# Patient Record
Sex: Male | Born: 1965 | Race: White | Hispanic: No | Marital: Married | Smoking: Current every day smoker
Health system: Southern US, Community
[De-identification: ages and names within clinical notes are randomized; demographics above are authoritative.]

## PROBLEM LIST (undated history)

## (undated) DIAGNOSIS — K219 Gastro-esophageal reflux disease without esophagitis: Secondary | ICD-10-CM

## (undated) HISTORY — PX: CHOLECYSTECTOMY: SHX55

---

## 1999-11-01 ENCOUNTER — Encounter: Payer: Self-pay | Admitting: Orthopedic Surgery

## 1999-11-01 ENCOUNTER — Encounter: Admission: RE | Admit: 1999-11-01 | Discharge: 1999-11-01 | Payer: Self-pay | Admitting: Orthopedic Surgery

## 2000-01-21 ENCOUNTER — Encounter: Payer: Self-pay | Admitting: Anesthesiology

## 2000-01-21 ENCOUNTER — Encounter: Admission: RE | Admit: 2000-01-21 | Discharge: 2000-01-30 | Payer: Self-pay | Admitting: Anesthesiology

## 2000-03-30 ENCOUNTER — Encounter: Admission: RE | Admit: 2000-03-30 | Discharge: 2000-06-28 | Payer: Self-pay | Admitting: Anesthesiology

## 2008-10-26 ENCOUNTER — Emergency Department (HOSPITAL_BASED_OUTPATIENT_CLINIC_OR_DEPARTMENT_OTHER): Admission: EM | Admit: 2008-10-26 | Discharge: 2008-10-26 | Payer: Self-pay | Admitting: Emergency Medicine

## 2008-10-30 ENCOUNTER — Emergency Department (HOSPITAL_BASED_OUTPATIENT_CLINIC_OR_DEPARTMENT_OTHER): Admission: EM | Admit: 2008-10-30 | Discharge: 2008-10-30 | Payer: Self-pay | Admitting: Emergency Medicine

## 2008-11-02 ENCOUNTER — Encounter: Admission: RE | Admit: 2008-11-02 | Discharge: 2008-12-18 | Payer: Self-pay | Admitting: Orthopedic Surgery

## 2009-02-15 ENCOUNTER — Ambulatory Visit: Payer: Self-pay | Admitting: Interventional Radiology

## 2009-02-15 ENCOUNTER — Encounter: Payer: Self-pay | Admitting: Emergency Medicine

## 2009-02-15 ENCOUNTER — Inpatient Hospital Stay (HOSPITAL_COMMUNITY): Admission: AD | Admit: 2009-02-15 | Discharge: 2009-02-16 | Payer: Self-pay | Admitting: Internal Medicine

## 2009-11-06 IMAGING — CR DG LUMBAR SPINE COMPLETE 4+V
5 series · 5 of 5 positions shown · non-contrast
Comparison: None

CLINICAL DATA: Back pain.

LUMBAR SPINE - COMPLETE 4+ VIEW

[t l-spine a.p.]
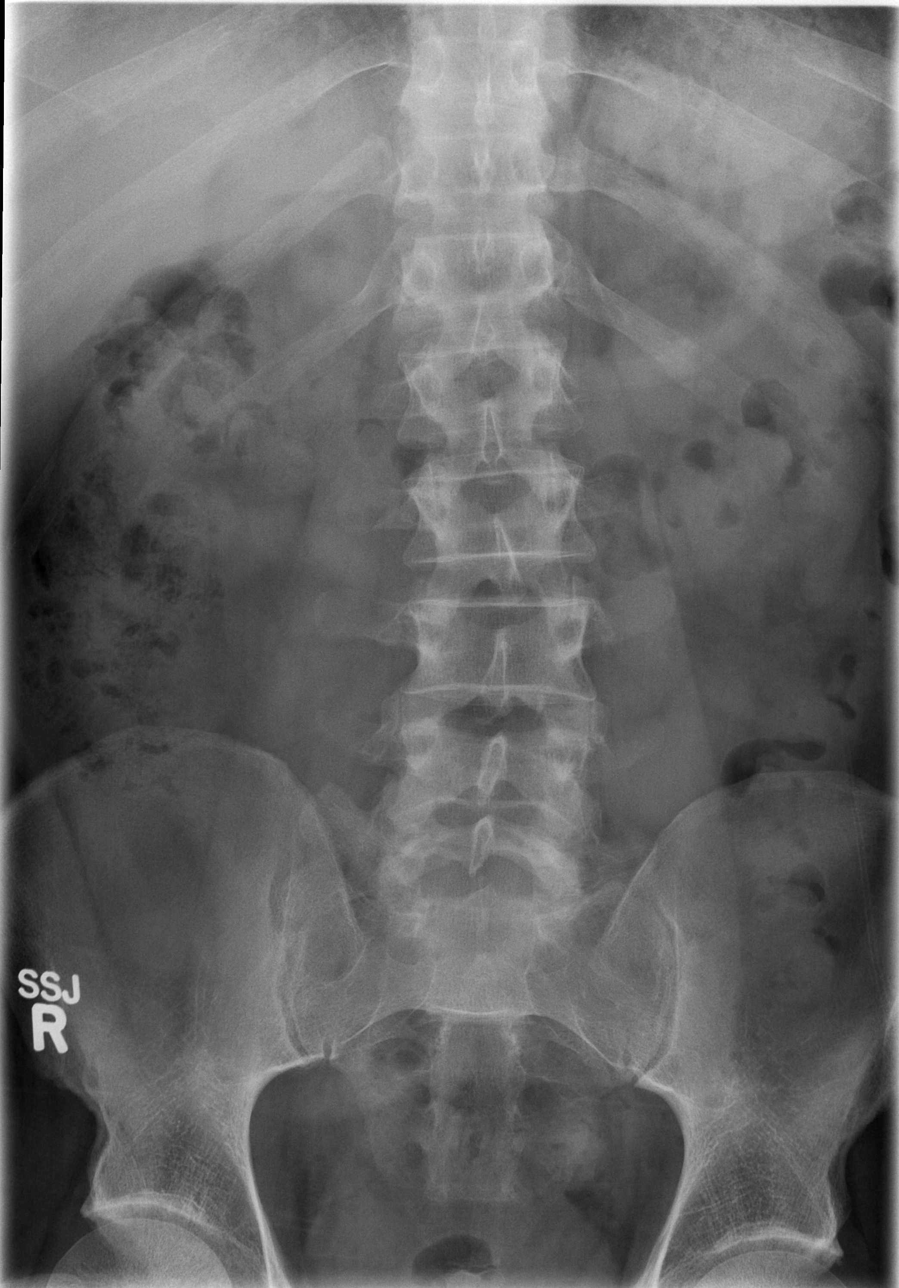

[t l-spine oblique exposure (1 of 2)]
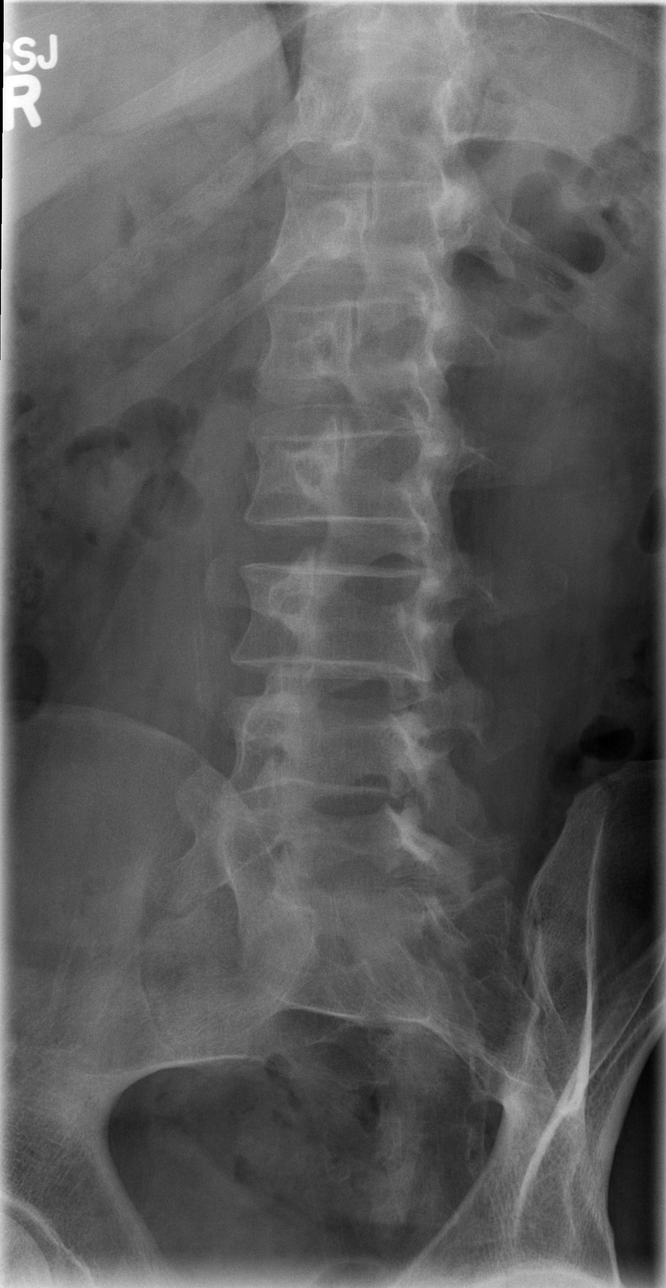

[t l-spine oblique exposure (2 of 2)]
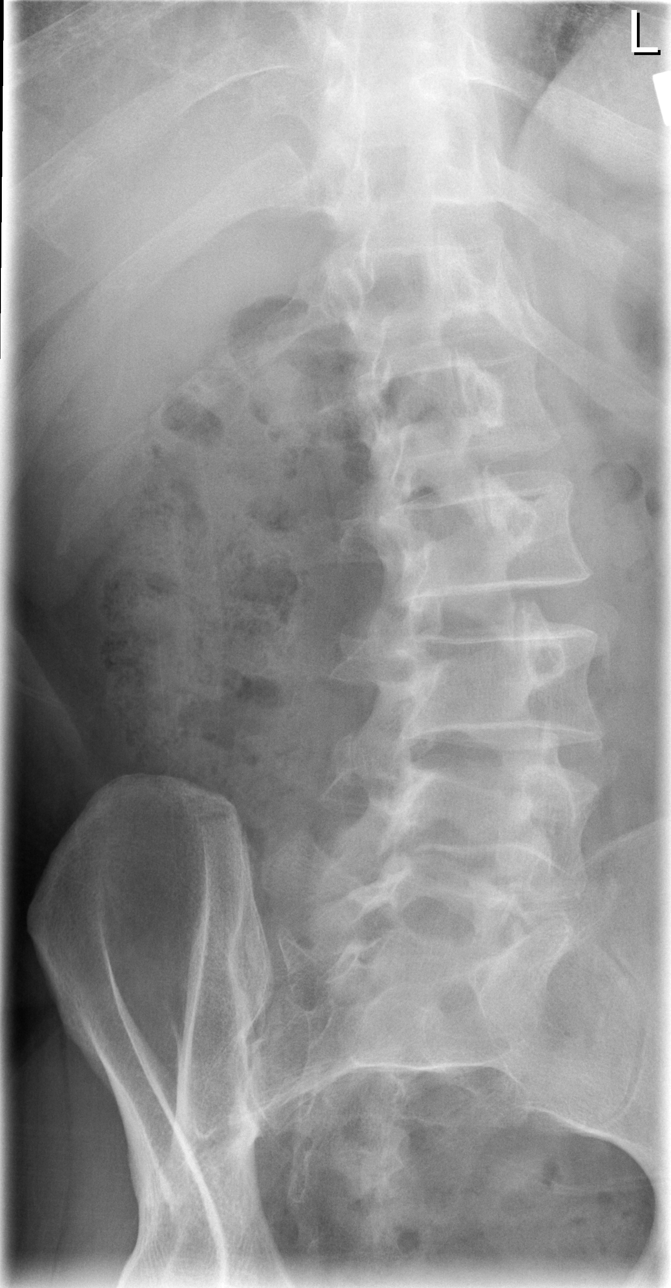

[t l-spine lat]
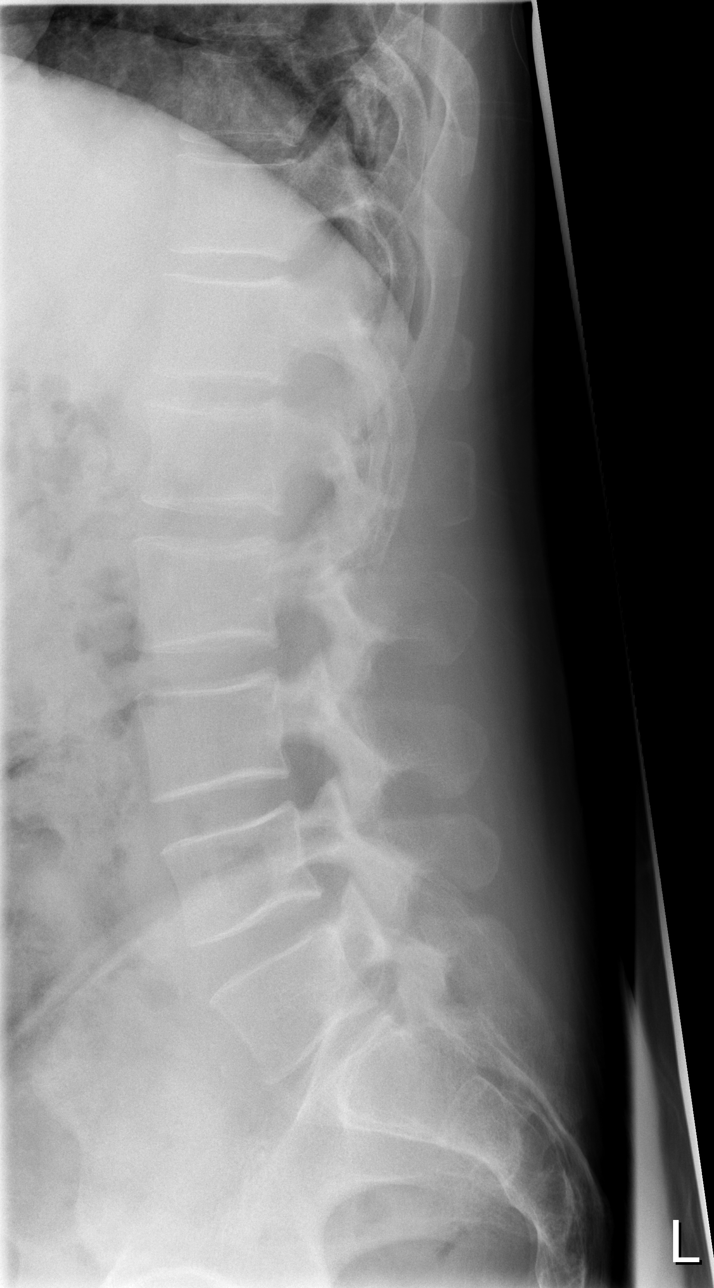

[t l-spine l5-s1 spot]
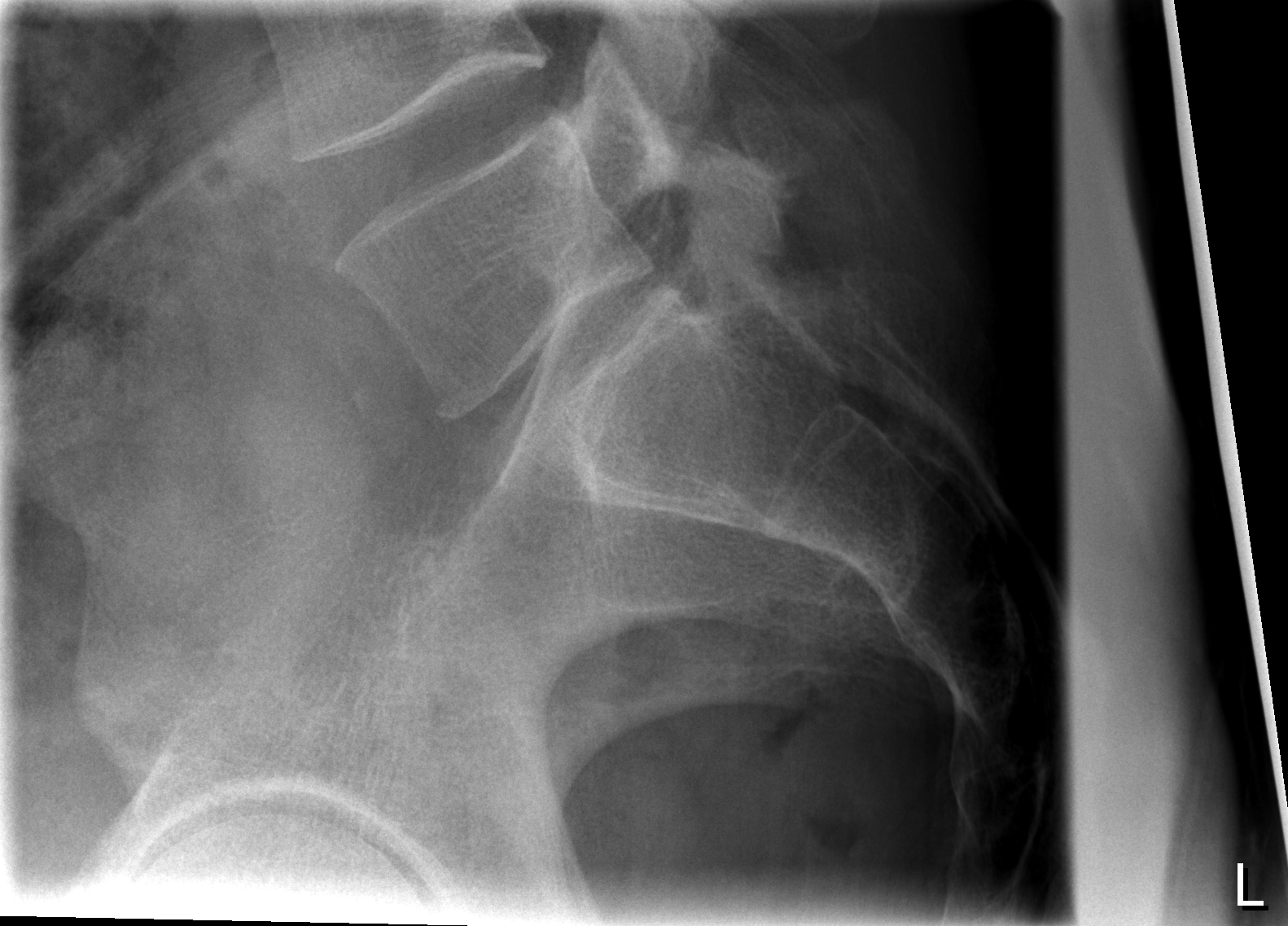

[5 of 5 positions shown; findings below may reference images not displayed]

FINDINGS: Mild levoconvex lumbar scoliosis, centered around L2-L3.
No fracture is identified.  Sacralization of the right L5
transverse process.  Vertebral body height is preserved.  No pars
defects are identified.  Intervertebral disc space preserved as
well.  Five lumbar type vertebral bodies are present.  Lumbosacral
junction normal.
IMPRESSION: No acute osseous abnormality.

## 2011-01-19 ENCOUNTER — Encounter: Payer: Self-pay | Admitting: Family Medicine

## 2011-04-15 LAB — HEMOGLOBIN A1C: Mean Plasma Glucose: 94 mg/dL

## 2011-04-15 LAB — DIFFERENTIAL
Basophils Absolute: 0 10*3/uL (ref 0.0–0.1)
Basophils Relative: 0 % (ref 0–1)
Lymphocytes Relative: 61 % — ABNORMAL HIGH (ref 12–46)
Monocytes Absolute: 0.3 10*3/uL (ref 0.1–1.0)
Neutro Abs: 2 10*3/uL (ref 1.7–7.7)
Neutrophils Relative %: 32 % — ABNORMAL LOW (ref 43–77)

## 2011-04-15 LAB — BASIC METABOLIC PANEL
BUN: 10 mg/dL (ref 6–23)
CO2: 24 mEq/L (ref 19–32)
Calcium: 9.1 mg/dL (ref 8.4–10.5)
Chloride: 109 mEq/L (ref 96–112)
Creatinine, Ser: 0.7 mg/dL (ref 0.4–1.5)
GFR calc Af Amer: >60 mL/min (ref >60)
GFR calc non Af Amer: >60 mL/min (ref >60)
Glucose, Bld: 130 mg/dL — ABNORMAL HIGH (ref 70–99)
Potassium: 3.7 mEq/L (ref 3.5–5.1)
Sodium: 141 mEq/L (ref 135–145)

## 2011-04-15 LAB — CBC
HCT: 42.3 % (ref 39.0–52.0)
Hemoglobin: 14.3 g/dL (ref 13.0–17.0)
Hemoglobin: 14.1 g/dL (ref 13.0–17.0)
MCHC: 33.8 g/dL (ref 30.0–36.0)
MCHC: 35.1 g/dL (ref 30.0–36.0)
MCV: 98 fL (ref 78.0–100.0)
Platelets: 161 10*3/uL (ref 150–400)
Platelets: 142 10*3/uL — ABNORMAL LOW (ref 150–400)
RBC: 4.3 MIL/uL (ref 4.22–5.81)
RDW: 10.5 % — ABNORMAL LOW (ref 11.5–15.5)
RDW: 12.7 % (ref 11.5–15.5)
WBC: 5.9 10*3/uL (ref 4.0–10.5)

## 2011-04-15 LAB — CARDIAC PANEL(CRET KIN+CKTOT+MB+TROPI)
CK, MB: 1 ng/mL (ref 0.3–4.0)
CK, MB: 1.1 ng/mL (ref 0.3–4.0)
Relative Index: INVALID (ref 0.0–2.5)
Relative Index: INVALID (ref 0.0–2.5)
Total CK: 48 U/L (ref 7–232)
Total CK: 51 U/L (ref 7–232)
Troponin I: 0.01 ng/mL (ref 0.00–0.06)
Troponin I: <0.01 ng/mL (ref 0.00–0.06)

## 2011-04-15 LAB — POCT CARDIAC MARKERS
CKMB, poc: <1 ng/mL — ABNORMAL LOW (ref 1.0–8.0)
Myoglobin, poc: 52.8 ng/mL (ref 12–200)
Troponin i, poc: <0.05 ng/mL (ref 0.00–0.09)

## 2011-04-15 LAB — TSH: TSH: 1.144 u[IU]/mL (ref 0.350–4.500)

## 2011-05-13 NOTE — H&P (Signed)
NAME:  Thomas Glover, Thomas Glover NO.:  [id number]   MEDICAL RECORD NO.:  [id number]          PATIENT TYPE:  INP   LOCATION:  4733                         FACILITY:  [facility]   PHYSICIAN:  Renee Ramus, MD       DATE OF BIRTH:  [birth year]   DATE OF ADMISSION:  02/15/2009  DATE OF DISCHARGE:                              HISTORY & PHYSICAL   PRIMARY CARE PHYSICIAN:  Unassigned.   HISTORY OF PRESENT ILLNESS:  The patient is a 45 year old male with  chest pain x2 days prior to admission.  The patient reports over the  last 48 hours his chest pain has increased in intensity.  He describes  it as a dull ache or someone standing on my chest.  The patient also  reports radiation to the left shoulder.  The patient reports last p.m.  he woke with left chest pressure and pain with some diaphoresis.  The  patient denies any exacerbating or relieving factors.  The patient has  not had these symptoms previously.  The patient was seen in the  emergency department.  There he had negative enzymes set as well as a  normal EKG, and normal chest x-ray.  He is being admitted for further  evaluation and treatment and risk of stratification.  Cardiac risk  factors include tobacco use and high blood pressure.   PAST MEDICAL HISTORY:  1. Hypertension.  2. Low back pain, chronic.  3. Anxiety.   SOCIAL HISTORY:  The patient reports smoking one-half to one pack per  day, but denies alcohol or illicit drug use.   FAMILY HISTORY:  Not available.   REVIEW OF SYSTEMS:  All other comprehensive review of systems are  negative.   ALLERGIES:  The patient has no known drug allergies.   CURRENT MEDICATIONS:  1. Metoprolol 25 mg 1 p.o. b.i.d.  2. Ambien 10 mg p.o. at bedtime.  3. Ativan 0.5 mg p.o. q.8 h. p.r.n. anxiety.   PHYSICAL EXAMINATION:  GENERAL:  He is a well-developed, well-nourished  white male currently in no apparent distress.  VITAL SIGNS:  Blood pressure 142/74, heart rate 85,  respiratory rate 16,  and temperature 97.4.  HEENT:  No jugular venous distention or lymphadenopathy.  Oropharynx is  clear.  Mucous membranes pink and moist.  TMs clear bilaterally.  Pupils  equal and reactive to light and accommodation.  Extraocular muscles are  intact.  CARDIOVASCULAR:  He has a regular rate and rhythm without murmurs, rubs,  or gallops.  PULMONARY:  Lungs are clear to auscultation bilaterally.  ABDOMEN:  Soft, nontender, and nondistended without hepatosplenomegaly.  Bowel sounds are present.  He has no rebound or guarding.  EXTREMITIES:  He has no clubbing, cyanosis, or edema.  He has good  peripheral pulses in dorsalis pedis and radial arteries.  He is able to  move all extremities.  NEUROLOGIC:  Cranial nerves II-XII are grossly intact.  He has no focal  neurological deficits.   STUDIES:  1. EKG shows normal sinus rhythm.  2. Chest x-ray shows no acute disease.   LABORATORY DATA:  White count  5.9, H and H 14 and 42, MCV 98, and  platelets 161.  Sodium 141, potassium 3.7, chloride 109, bicarb 24, BUN  10, creatinine 0.7, and glucose 130.  Troponin I less than 0.05.   ASSESSMENT AND PLAN:  1. Chest pain.  The patient requires risk stratification likely a      exercise stress test.  We will try GI cocktail and morphine sulfate      for pain.  We will make the patient n.p.o. after midnight and hold      beta blockers.  2. Hypertension, currently stable.  3. Tobacco use.  The patient is on nicotine patch.  We will continue      this.  4. Anxiety, currently stable.  5. Lower back pain.  No current problems.  The patient not requiring      pain medication for this.  6. Disposition.  We will reassess after stress, likely discharge to      home.   H and P was constructed by reviewing past medical history, conferring  with emergency medical room physician, and reviewing the emergency  medical record.   TIME SPENT:  1 hour.      Renee Ramus, MD   Electronically Signed     JF/MEDQ  D:  02/15/2009  T:  02/15/2009  Job:  [id number]

## 2011-12-24 ENCOUNTER — Ambulatory Visit: Payer: Self-pay | Admitting: Medical

## 2012-04-08 ENCOUNTER — Ambulatory Visit: Payer: Self-pay | Admitting: Family Medicine

## 2015-02-03 ENCOUNTER — Observation Stay (HOSPITAL_BASED_OUTPATIENT_CLINIC_OR_DEPARTMENT_OTHER)
Admission: EM | Admit: 2015-02-03 | Discharge: 2015-02-05 | Disposition: A | Discharge status: Home / Self Care | Payer: BLUE CROSS/BLUE SHIELD | Attending: General Surgery | Admitting: General Surgery

## 2015-02-03 ENCOUNTER — Emergency Department (HOSPITAL_BASED_OUTPATIENT_CLINIC_OR_DEPARTMENT_OTHER): Payer: BLUE CROSS/BLUE SHIELD

## 2015-02-03 ENCOUNTER — Encounter (HOSPITAL_BASED_OUTPATIENT_CLINIC_OR_DEPARTMENT_OTHER): Payer: Self-pay | Admitting: *Deleted

## 2015-02-03 DIAGNOSIS — K37 Unspecified appendicitis: Secondary | ICD-10-CM | POA: Diagnosis present

## 2015-02-03 DIAGNOSIS — F17210 Nicotine dependence, cigarettes, uncomplicated: Secondary | ICD-10-CM | POA: Insufficient documentation

## 2015-02-03 DIAGNOSIS — K3580 Unspecified acute appendicitis: Principal | ICD-10-CM | POA: Insufficient documentation

## 2015-02-03 LAB — URINALYSIS, ROUTINE W REFLEX MICROSCOPIC
BILIRUBIN URINE: NEGATIVE
GLUCOSE, UA: NEGATIVE mg/dL
HGB URINE DIPSTICK: NEGATIVE
KETONES UR: NEGATIVE mg/dL
Leukocytes, UA: NEGATIVE
NITRITE: NEGATIVE
PH: 6 (ref 5.0–8.0)
PROTEIN: NEGATIVE mg/dL
Specific Gravity, Urine: 1.016 (ref 1.005–1.030)
Urobilinogen, UA: 0.2 mg/dL (ref 0.0–1.0)

## 2015-02-03 LAB — COMPREHENSIVE METABOLIC PANEL
ALK PHOS: 109 U/L (ref 39–117)
ALT: 18 U/L (ref 0–53)
AST: 18 U/L (ref 0–37)
Albumin: 4.4 g/dL (ref 3.5–5.2)
Anion gap: 5 (ref 5–15)
BILIRUBIN TOTAL: 0.8 mg/dL (ref 0.3–1.2)
BUN: 9 mg/dL (ref 6–23)
CHLORIDE: 105 mmol/L (ref 96–112)
CO2: 24 mmol/L (ref 19–32)
Calcium: 8.8 mg/dL (ref 8.4–10.5)
Creatinine, Ser: 0.75 mg/dL (ref 0.50–1.35)
GFR calc Af Amer: >90 mL/min (ref >90)
GLUCOSE: 108 mg/dL — AB (ref 70–99)
Potassium: 3.5 mmol/L (ref 3.5–5.1)
Sodium: 134 mmol/L — ABNORMAL LOW (ref 135–145)
TOTAL PROTEIN: 6.6 g/dL (ref 6.0–8.3)

## 2015-02-03 LAB — CBC WITH DIFFERENTIAL/PLATELET
BASOS ABS: 0 10*3/uL (ref 0.0–0.1)
Basophils Relative: 0 % (ref 0–1)
Eosinophils Absolute: 0.1 10*3/uL (ref 0.0–0.7)
Eosinophils Relative: 0 % (ref 0–5)
HCT: 44.6 % (ref 39.0–52.0)
HEMOGLOBIN: 15.5 g/dL (ref 13.0–17.0)
LYMPHS ABS: 1.8 10*3/uL (ref 0.7–4.0)
LYMPHS PCT: 13 % (ref 12–46)
MCH: 32.7 pg (ref 26.0–34.0)
MCHC: 34.8 g/dL (ref 30.0–36.0)
MCV: 94.1 fL (ref 78.0–100.0)
MONOS PCT: 4 % (ref 3–12)
Monocytes Absolute: 0.5 10*3/uL (ref 0.1–1.0)
Neutro Abs: 11.2 10*3/uL — ABNORMAL HIGH (ref 1.7–7.7)
Neutrophils Relative %: 83 % — ABNORMAL HIGH (ref 43–77)
Platelets: 167 10*3/uL (ref 150–400)
RBC: 4.74 MIL/uL (ref 4.22–5.81)
RDW: 12.3 % (ref 11.5–15.5)
WBC: 13.6 10*3/uL — ABNORMAL HIGH (ref 4.0–10.5)

## 2015-02-03 LAB — LIPASE, BLOOD: LIPASE: 21 U/L (ref 11–59)

## 2015-02-03 MED ORDER — MORPHINE SULFATE 4 MG/ML IJ SOLN
4.0000 mg | Freq: Once | INTRAMUSCULAR | Status: AC
  Administered 2015-02-03: 4 mg via INTRAVENOUS
  Filled 2015-02-03: qty 1

## 2015-02-03 MED ORDER — IOHEXOL 300 MG/ML  SOLN: 100.0000 mL | Freq: Once | INTRAMUSCULAR | Status: AC | PRN

## 2015-02-03 MED ORDER — ONDANSETRON HCL 4 MG/2ML IJ SOLN
4.0000 mg | Freq: Once | INTRAMUSCULAR | Status: AC
  Administered 2015-02-03: 4 mg via INTRAVENOUS
  Filled 2015-02-03: qty 2

## 2015-02-03 MED ORDER — HYDROMORPHONE HCL 1 MG/ML IJ SOLN
0.5000 mg | Freq: Once | INTRAMUSCULAR | Status: AC
  Administered 2015-02-03: 0.5 mg via INTRAVENOUS
  Filled 2015-02-03: qty 1

## 2015-02-03 MED ORDER — IOHEXOL 300 MG/ML  SOLN
50.0000 mL | Freq: Once | INTRAMUSCULAR | Status: AC | PRN
  Administered 2015-02-03: 50 mL via ORAL

## 2015-02-03 MED ORDER — SODIUM CHLORIDE 0.9 % IV BOLUS (SEPSIS)
1000.0000 mL | Freq: Once | INTRAVENOUS | Status: AC
  Administered 2015-02-03: 1000 mL via INTRAVENOUS

## 2015-02-03 NOTE — ED Provider Notes (Signed)
48[facility]. No new complaints. Received fentanyl in transfer. Declining additional pain meds at this time. Nursing reports that secretary paging Dr Carolynne Edouardoth, surgery.   Raeford RazorStephen Jian Hodgman, MD 02/03/15 [phone]

## 2015-02-03 NOTE — ED Notes (Signed)
C/o pain and chills, (denies: nausea or other sx), alert, NAD, calm, interactive.

## 2015-02-03 NOTE — ED Notes (Signed)
[facility] and [facility], pending [facility] arrival. Pt alert, NAD, calm, interactive, resps e/u speaking in clear complete sentences.

## 2015-02-03 NOTE — ED Notes (Signed)
Bed: [id number] Expected date:  Expected time:  Means of arrival:  Comments: [facility] Appe, call Carolynne Edouardoth on arrival

## 2015-02-03 NOTE — ED Provider Notes (Signed)
CSN: [id number]     Arrival date & time 02/03/15  1710 History   First MD Initiated Contact with Patient 02/03/15 1824     Chief Complaint  Patient presents with  . Abdominal Pain     (Consider location/radiation/quality/duration/timing/severity/associated sxs/prior Treatment) The history is provided by the patient. No language interpreter was used.  Thomas Glover is a 49 y/o M with PMHx of cholecystectomy approximately one year ago presenting to the ED with abdominal pain that started this afternoon and progressively gotten worse, localized to the RLQ. Patient reported that the pain is a sharp pain, reported that it "hurts like hell" that comes in waves. Stated that the pain does radiate across the abdomen. Reported that he has been having chills and nausea. Reported that he felt fine yesterday. Denied vomiting, diarrhea, melena, hematochezia, chest pain, shortness of breath, difficulty breathing, testicular pain, penile discharge, penile pain or swelling, hematuria, dysuria, fever, back pain. PCP none  History reviewed. No pertinent past medical history. Past Surgical History  Procedure Laterality Date  . Cholecystectomy     No family history on file. History  Substance Use Topics  . Smoking status: Current Every Day Smoker  . Smokeless tobacco: Not on file  . Alcohol Use: No    Review of Systems  Constitutional: Positive for chills. Negative for fever.  Respiratory: Negative for chest tightness and shortness of breath.   Cardiovascular: Negative for chest pain.  Gastrointestinal: Positive for nausea and abdominal pain. Negative for vomiting, diarrhea, constipation, blood in stool and anal bleeding.  Genitourinary: Negative for dysuria, hematuria, discharge, penile swelling, penile pain and testicular pain.  Musculoskeletal: Negative for back pain and neck pain.      Allergies  Review of patient's allergies indicates no known allergies.  Home Medications   Prior to  Admission medications   Medication Sig Start Date End Date Taking? Authorizing Provider  esomeprazole (NEXIUM) 20 MG capsule Take 20 mg by mouth daily at 12 noon.   Yes Historical Provider, MD  zolpidem (AMBIEN) 10 MG tablet Take 10 mg by mouth at bedtime as needed for sleep.   Yes Historical Provider, MD   BP 142/85 mmHg  Pulse 79  Temp(Src) 99.5 F (37.5 C) (Oral)  Resp 18  Ht 6\' 2"  (1.88 m)  Wt 175 lb (79.379 kg)  BMI 22.46 kg/m2  SpO2 98% Physical Exam  Constitutional: He is oriented to person, place, and time. He appears well-developed and well-nourished. No distress.  HENT:  Head: Normocephalic and atraumatic.  Eyes: Conjunctivae and EOM are normal. Right eye exhibits no discharge. Left eye exhibits no discharge.  Neck: Normal range of motion. Neck supple.  Cardiovascular: Normal rate, regular rhythm and normal heart sounds.  Exam reveals no friction rub.   No murmur heard. Pulses:      Radial pulses are 2+ on the right side, and 2+ on the left side.  Pulmonary/Chest: Effort normal and breath sounds normal. No respiratory distress. He has no wheezes. He has no rales.  Abdominal: Soft. Normal appearance and bowel sounds are normal. He exhibits no distension. There is tenderness in the right lower quadrant. There is tenderness at McBurney's point. There is no rigidity, no rebound, no guarding and negative Murphy's sign.  Genitourinary:  Testicular exam: negative swelling, erythema, inflammation, lesions, sores, asymmetry identified. Negative pain upon palpation. Negative hydrocele. Negative varicocele. Negative high riding testicle. Negative inguinal lymphadenopathy bilaterally. Exam chaperoned with nurse, Jerrye Beavers.  Musculoskeletal: Normal range of motion.  Neurological: He is  alert and oriented to person, place, and time. No cranial nerve deficit. He exhibits normal muscle tone. Coordination normal.  Skin: Skin is warm and dry. No rash noted. He is not diaphoretic. No erythema.   Nursing note and vitals reviewed.   ED Course  Procedures (including critical care time)  Results for orders placed or performed during the hospital encounter of 02/03/15  Urinalysis, Routine w reflex microscopic  Result Value Ref Range   Color, Urine YELLOW YELLOW   APPearance CLEAR CLEAR   Specific Gravity, Urine 1.016 1.005 - 1.030   pH 6.0 5.0 - 8.0   Glucose, UA NEGATIVE NEGATIVE mg/dL   Hgb urine dipstick NEGATIVE NEGATIVE   Bilirubin Urine NEGATIVE NEGATIVE   Ketones, ur NEGATIVE NEGATIVE mg/dL   Protein, ur NEGATIVE NEGATIVE mg/dL   Urobilinogen, UA 0.2 0.0 - 1.0 mg/dL   Nitrite NEGATIVE NEGATIVE   Leukocytes, UA NEGATIVE NEGATIVE  CBC with Differential/Platelet  Result Value Ref Range   WBC 13.6 (H) 4.0 - 10.5 K/uL   RBC 4.74 4.22 - 5.81 MIL/uL   Hemoglobin 15.5 13.0 - 17.0 g/dL   HCT 16.1 09.6 - 04.5 %   MCV 94.1 78.0 - 100.0 fL   MCH 32.7 26.0 - 34.0 pg   MCHC 34.8 30.0 - 36.0 g/dL   RDW 40.9 81.1 - 91.4 %   Platelets 167 150 - 400 K/uL   Neutrophils Relative % 83 (H) 43 - 77 %   Neutro Abs 11.2 (H) 1.7 - 7.7 K/uL   Lymphocytes Relative 13 12 - 46 %   Lymphs Abs 1.8 0.7 - 4.0 K/uL   Monocytes Relative 4 3 - 12 %   Monocytes Absolute 0.5 0.1 - 1.0 K/uL   Eosinophils Relative 0 0 - 5 %   Eosinophils Absolute 0.1 0.0 - 0.7 K/uL   Basophils Relative 0 0 - 1 %   Basophils Absolute 0.0 0.0 - 0.1 K/uL  Comprehensive metabolic panel  Result Value Ref Range   Sodium 134 (L) 135 - 145 mmol/L   Potassium 3.5 3.5 - 5.1 mmol/L   Chloride 105 96 - 112 mmol/L   CO2 24 19 - 32 mmol/L   Glucose, Bld 108 (H) 70 - 99 mg/dL   BUN 9 6 - 23 mg/dL   Creatinine, Ser 7.82 0.50 - 1.35 mg/dL   Calcium 8.8 8.4 - 95.6 mg/dL   Total Protein 6.6 6.0 - 8.3 g/dL   Albumin 4.4 3.5 - 5.2 g/dL   AST 18 0 - 37 U/L   ALT 18 0 - 53 U/L   Alkaline Phosphatase 109 39 - 117 U/L   Total Bilirubin 0.8 0.3 - 1.2 mg/dL   GFR calc non Af Amer >90 >90 mL/min   GFR calc Af Amer >90 >90 mL/min    Anion gap 5 5 - 15  Lipase, blood  Result Value Ref Range   Lipase 21 11 - 59 U/L    Labs Review Labs Reviewed  CBC WITH DIFFERENTIAL/PLATELET - Abnormal; Notable for the following:    WBC 13.6 (*)    Neutrophils Relative % 83 (*)    Neutro Abs 11.2 (*)    All other components within normal limits  COMPREHENSIVE METABOLIC PANEL - Abnormal; Notable for the following:    Sodium 134 (*)    Glucose, Bld 108 (*)    All other components within normal limits  URINALYSIS, ROUTINE W REFLEX MICROSCOPIC  LIPASE, BLOOD    Imaging Review Ct Abdomen Pelvis  W Contrast  02/03/2015   CLINICAL DATA:  Severe right lower lower quadrant abdominal pain associated with chills which began earlier today. Leukocytosis with [place] blood count of [address]. Tenderness to palpation in the right lower quadrant at clinical examination. Surgical history includes cholecystectomy.  EXAM: CT ABDOMEN AND PELVIS WITH CONTRAST  TECHNIQUE: Multidetector CT imaging of the abdomen and pelvis was performed using the standard protocol following bolus administration of intravenous contrast.  CONTRAST:  100 ml OMNIPAQUE IOHEXOL 300 MG/ML IV. Oral contrast was also administered.  COMPARISON:  12/14/2013.  FINDINGS: Mild wall thickening and edema involving the cecum, ascending colon, and proximal and mid transverse colon with similar findings to a lesser degree involving the descending colon, though these segments are nearly completely collapsed. Sigmoid colon and rectum normal in appearance. Small bowel normal in appearance. Normal appearing stomach filled with oral contrast. Mildly dilated appendix up to approximately 10 mm with mild edema/ inflammation adjacent to the appendiceal tip in the right upper pelvis. No ascites. No extraluminal gas. No abnormal fluid collections.  Normal appearing liver, spleen, pancreas, adrenal glands and kidneys. Gallbladder surgically absent. No unexpected biliary ductal dilation. Moderate aortoiliofemoral  atherosclerosis. No significant lymphadenopathy.  Urinary bladder decompressed and unremarkable. Borderline prostate gland enlargement. Normal seminal vesicles.  Bone window images unremarkable. Visualized lung bases clear apart from the expected dependent atelectasis posteriorly. Heart size upper normal.  IMPRESSION: 1. Mildly dilated appendix up to approximately 10 mm with early acute appendicitis suspected, especially involving the tip of the appendix. No evidence of perforation or abscess. 2. Wall thickening involving a large portion of the colon which I suspect is related to the fact that these segments are nearly completely collapsed. If the patient has had diarrhea, then this could represent colitis. 3. Moderate aortoiliofemoral atherosclerosis, somewhat advanced for age. These results results were called by telephone at the time of interpretation on 02/03/2015 at 8:28 pm to Cephus Richer, MD, who verbally acknowledged these results.   Electronically Signed   By: Hulan Saas M.D.   On: 02/03/2015 20:31     EKG Interpretation None       9:22 PM This provider spoke with Dr. Carolynne Edouard, on-call general surgeon at [facility] ED. This case, labs, imaging, vitals in great detail. Recommended patient to be transferred to [facility] ED where surgery will see patient there. No recommendations for antibiotics given.   9:52 PM Spoke with Dr. Ardeen Jourdain, ED physician at [facility]. Discussed that patient is to be transferred from [facility] and that Dr. Carolynne Edouard is to be called regarding his arrival. Physician understood and agreed to plan. Patient to be turned transferred.  MDM   Final diagnoses:  Acute appendicitis, unspecified acute appendicitis type    Medications  iohexol (OMNIPAQUE) 300 MG/ML solution 100 mL (not administered)  morphine 4 MG/ML injection 4 mg (not administered)  morphine 4 MG/ML injection 4 mg (4 mg Intravenous Given 02/03/15 1922)  sodium chloride 0.9 % bolus 1,000 mL (0 mLs  Intravenous Stopped 02/03/15 2156)  iohexol (OMNIPAQUE) 300 MG/ML solution 50 mL (50 mLs Oral Contrast Given 02/03/15 1907)  ondansetron (ZOFRAN) injection 4 mg (4 mg Intravenous Given 02/03/15 1936)  HYDROmorphone (DILAUDID) injection 0.5 mg (0.5 mg Intravenous Given 02/03/15 2157)   Filed Vitals:   02/03/15 1840 02/03/15 2027 02/03/15 2150 02/03/15 2228  BP:  131/69 122/70 142/85  Pulse:  92 86 79  Temp: 98 F (36.7 C)  99.5 F (37.5 C)   TempSrc: Oral  Oral   Resp:  18 20 18   Height:      Weight:      SpO2:  96% 98% 98%   CBC noted mildly elevated white blood cell count of 13.6. Hemoglobin 15.5, hematocrit 44.6. CMP unremarkable. Lipase negative elevation. Urinalysis negative for infection-negative nitrites, leukocytes noted. CT abdomen and pelvis with contrast noted mildly dilated appendix up to approximately 10 mm with early acute appendicitis suspected, especially involving the tip. No evidence of perforation or abscess. Wall thickening involving a large portion of the colon which could be possible colitis. Discussed case in great detail with Dr. Carolynne Edouardoth, [facility]. As per physician [facility] and that general surgery will see patient there. Discussed with Dr. Ardeen JourdainM. Belfi that patient is to be transferred from ED to ED and that general surgery to be called, understood. Discussed plan for transfer and admission - patient understood and agreed to plan of care. Patient stable for transfer.   Raymon MuttonMarissa Annette Liotta, PA-C 02/03/15 2203  Raymon MuttonMarissa Bryson Palen, PA-C 02/03/15 [id number]  Enid SkeensJoshua M Zavitz, MD 02/04/15 [phone]

## 2015-02-03 NOTE — ED Notes (Addendum)
Pt c/o upper left abd pain. Describes as sharp and constant. Rates 8/10. PA aware that patient is having pain to upper abdomen.

## 2015-02-03 NOTE — ED Notes (Signed)
[facility] has given 2312-4mg  of morphine, 2325-16000mcg of fentanyl.

## 2015-02-03 NOTE — ED Notes (Signed)
Severe pain in lower right abd area. Started today. He states that also has chills.

## 2015-02-03 NOTE — ED Notes (Signed)
[facility] here for transport, no changes, [facility]

## 2015-02-04 ENCOUNTER — Observation Stay (HOSPITAL_COMMUNITY): Payer: BLUE CROSS/BLUE SHIELD | Admitting: Certified Registered"

## 2015-02-04 ENCOUNTER — Encounter (HOSPITAL_COMMUNITY): Payer: Self-pay | Admitting: Emergency Medicine

## 2015-02-04 ENCOUNTER — Observation Stay (HOSPITAL_COMMUNITY): Payer: BLUE CROSS/BLUE SHIELD | Admitting: Anesthesiology

## 2015-02-04 ENCOUNTER — Encounter (HOSPITAL_COMMUNITY)
Admission: EM | Disposition: A | Discharge status: Home / Self Care | Payer: Self-pay | Source: Home / Self Care | Attending: Emergency Medicine

## 2015-02-04 DIAGNOSIS — K37 Unspecified appendicitis: Secondary | ICD-10-CM | POA: Diagnosis present

## 2015-02-04 HISTORY — PX: LAPAROSCOPIC APPENDECTOMY: SHX408

## 2015-02-04 LAB — SURGICAL PCR SCREEN
MRSA, PCR: NEGATIVE
STAPHYLOCOCCUS AUREUS: POSITIVE — AB

## 2015-02-04 SURGERY — APPENDECTOMY, LAPAROSCOPIC
Anesthesia: General | Site: Abdomen

## 2015-02-04 SURGERY — APPENDECTOMY, LAPAROSCOPIC
Anesthesia: General

## 2015-02-04 MED ORDER — MIDAZOLAM HCL 2 MG/2ML IJ SOLN
INTRAMUSCULAR | Status: AC
  Filled 2015-02-04: qty 2

## 2015-02-04 MED ORDER — PIPERACILLIN-TAZOBACTAM 3.375 G IVPB: 3.3750 g | Freq: Three times a day (TID) | INTRAVENOUS | Status: DC

## 2015-02-04 MED ORDER — SUCCINYLCHOLINE CHLORIDE 20 MG/ML IJ SOLN
INTRAMUSCULAR | Status: AC
Start: 2015-02-04 — End: 2015-02-04
  Filled 2015-02-04: qty 1

## 2015-02-04 MED ORDER — SODIUM CHLORIDE 0.9 % IJ SOLN
INTRAMUSCULAR | Status: AC
  Filled 2015-02-04: qty 10

## 2015-02-04 MED ORDER — FENTANYL CITRATE 0.05 MG/ML IJ SOLN
INTRAMUSCULAR | Status: DC | PRN
  Administered 2015-02-04 (×2): 50 ug via INTRAVENOUS

## 2015-02-04 MED ORDER — PROPOFOL 10 MG/ML IV BOLUS
INTRAVENOUS | Status: AC
  Filled 2015-02-04: qty 20

## 2015-02-04 MED ORDER — ONDANSETRON HCL 4 MG/2ML IJ SOLN
4.0000 mg | Freq: Four times a day (QID) | INTRAMUSCULAR | Status: DC | PRN
  Administered 2015-02-04 (×2): 4 mg via INTRAVENOUS
  Filled 2015-02-04 (×2): qty 2

## 2015-02-04 MED ORDER — PIPERACILLIN-TAZOBACTAM 3.375 G IVPB
3.3750 g | Freq: Three times a day (TID) | INTRAVENOUS | Status: DC
  Administered 2015-02-04 – 2015-02-05 (×4): 3.375 g via INTRAVENOUS
  Filled 2015-02-04 (×7): qty 50

## 2015-02-04 MED ORDER — NEOSTIGMINE METHYLSULFATE 10 MG/10ML IV SOLN
INTRAVENOUS | Status: DC | PRN
Start: 2015-02-04 — End: 2015-02-04
  Administered 2015-02-04: 3 mg via INTRAVENOUS

## 2015-02-04 MED ORDER — SUCCINYLCHOLINE CHLORIDE 20 MG/ML IJ SOLN
INTRAMUSCULAR | Status: DC | PRN
  Administered 2015-02-04: 120 mg via INTRAVENOUS

## 2015-02-04 MED ORDER — LIDOCAINE HCL (CARDIAC) 20 MG/ML IV SOLN
INTRAVENOUS | Status: AC
  Filled 2015-02-04: qty 5

## 2015-02-04 MED ORDER — MEPERIDINE HCL 25 MG/ML IJ SOLN: 6.2500 mg | INTRAMUSCULAR | Status: DC | PRN

## 2015-02-04 MED ORDER — KCL IN DEXTROSE-NACL 20-5-0.9 MEQ/L-%-% IV SOLN
INTRAVENOUS | Status: DC
  Administered 2015-02-04 – 2015-02-05 (×2): via INTRAVENOUS
  Filled 2015-02-04 (×5): qty 1000

## 2015-02-04 MED ORDER — LACTATED RINGERS IV SOLN
INTRAVENOUS | Status: DC | PRN
  Administered 2015-02-04: 10:00:00 via INTRAVENOUS

## 2015-02-04 MED ORDER — MORPHINE SULFATE 2 MG/ML IJ SOLN
1.0000 mg | INTRAMUSCULAR | Status: DC | PRN
  Administered 2015-02-04 (×3): 4 mg via INTRAVENOUS
  Administered 2015-02-04 (×2): 2 mg via INTRAVENOUS
  Administered 2015-02-04: 4 mg via INTRAVENOUS
  Administered 2015-02-05: 2 mg via INTRAVENOUS
  Filled 2015-02-04: qty 2
  Filled 2015-02-04 (×2): qty 1
  Filled 2015-02-04 (×2): qty 2
  Filled 2015-02-04: qty 1
  Filled 2015-02-04: qty 2
  Filled 2015-02-04: qty 1

## 2015-02-04 MED ORDER — PANTOPRAZOLE SODIUM 40 MG IV SOLR
40.0000 mg | Freq: Every day | INTRAVENOUS | Status: DC
  Administered 2015-02-04: 40 mg via INTRAVENOUS
  Filled 2015-02-04 (×2): qty 40

## 2015-02-04 MED ORDER — MIDAZOLAM HCL 2 MG/2ML IJ SOLN
INTRAMUSCULAR | Status: AC
Start: 2015-02-04 — End: 2015-02-04
  Filled 2015-02-04: qty 2

## 2015-02-04 MED ORDER — HYDROMORPHONE HCL 1 MG/ML IJ SOLN: 0.2500 mg | INTRAMUSCULAR | Status: DC | PRN

## 2015-02-04 MED ORDER — BUPIVACAINE-EPINEPHRINE (PF) 0.25% -1:200000 IJ SOLN
INTRAMUSCULAR | Status: AC
  Filled 2015-02-04: qty 30

## 2015-02-04 MED ORDER — PROMETHAZINE HCL 25 MG/ML IJ SOLN: 6.2500 mg | INTRAMUSCULAR | Status: DC | PRN

## 2015-02-04 MED ORDER — GLYCOPYRROLATE 0.2 MG/ML IJ SOLN
INTRAMUSCULAR | Status: DC | PRN
  Administered 2015-02-04: 0.4 mg via INTRAVENOUS
  Administered 2015-02-04: 0.2 mg via INTRAVENOUS

## 2015-02-04 MED ORDER — CHLORHEXIDINE GLUCONATE CLOTH 2 % EX PADS
6.0000 | MEDICATED_PAD | Freq: Every day | CUTANEOUS | Status: DC
  Administered 2015-02-04 – 2015-02-05 (×2): 6 via TOPICAL

## 2015-02-04 MED ORDER — ROCURONIUM BROMIDE 100 MG/10ML IV SOLN
INTRAVENOUS | Status: DC | PRN
  Administered 2015-02-04: 20 mg via INTRAVENOUS

## 2015-02-04 MED ORDER — PROPOFOL 10 MG/ML IV BOLUS
INTRAVENOUS | Status: DC | PRN
  Administered 2015-02-04: 200 mg via INTRAVENOUS

## 2015-02-04 MED ORDER — ONDANSETRON HCL 4 MG/2ML IJ SOLN
INTRAMUSCULAR | Status: DC | PRN
Start: 2015-02-04 — End: 2015-02-04
  Administered 2015-02-04: 4 mg via INTRAVENOUS

## 2015-02-04 MED ORDER — MIDAZOLAM HCL 5 MG/5ML IJ SOLN
INTRAMUSCULAR | Status: DC | PRN
  Administered 2015-02-04: 2 mg via INTRAVENOUS

## 2015-02-04 MED ORDER — FENTANYL CITRATE 0.05 MG/ML IJ SOLN
INTRAMUSCULAR | Status: AC
  Filled 2015-02-04: qty 5

## 2015-02-04 MED ORDER — VECURONIUM BROMIDE 10 MG IV SOLR
INTRAVENOUS | Status: AC
  Filled 2015-02-04: qty 10

## 2015-02-04 MED ORDER — BUPIVACAINE-EPINEPHRINE 0.25% -1:200000 IJ SOLN
INTRAMUSCULAR | Status: DC | PRN
  Administered 2015-02-04: 9 mL

## 2015-02-04 MED ORDER — PHENYLEPHRINE HCL 10 MG/ML IJ SOLN
INTRAMUSCULAR | Status: DC | PRN
Start: 2015-02-04 — End: 2015-02-04
  Administered 2015-02-04: 80 ug via INTRAVENOUS

## 2015-02-04 MED ORDER — LIDOCAINE HCL (CARDIAC) 20 MG/ML IV SOLN
INTRAVENOUS | Status: DC | PRN
  Administered 2015-02-04: 60 mg via INTRAVENOUS

## 2015-02-04 MED ORDER — MUPIROCIN 2 % EX OINT
1.0000 "application " | TOPICAL_OINTMENT | Freq: Two times a day (BID) | CUTANEOUS | Status: DC
  Administered 2015-02-04 – 2015-02-05 (×3): 1 via NASAL
  Filled 2015-02-04: qty 22

## 2015-02-04 SURGICAL SUPPLY — 33 items
APPLIER CLIP ROT 10 11.4 M/L (STAPLE)
BLADE SURG ROTATE 9660 (MISCELLANEOUS) IMPLANT
CANISTER SUCTION 2500CC (MISCELLANEOUS) ×3 IMPLANT
CHLORAPREP W/TINT 26ML (MISCELLANEOUS) ×3 IMPLANT
CLIP APPLIE ROT 10 11.4 M/L (STAPLE) IMPLANT
COVER SURGICAL LIGHT HANDLE (MISCELLANEOUS) ×3 IMPLANT
CUTTER FLEX LINEAR 45M (STAPLE) ×6 IMPLANT
DRAPE LAPAROSCOPIC ABDOMINAL (DRAPES) ×3 IMPLANT
ELECT REM PT RETURN 9FT ADLT (ELECTROSURGICAL) ×3
ELECTRODE REM PT RTRN 9FT ADLT (ELECTROSURGICAL) ×1 IMPLANT
ENDOLOOP SUT PDS II  0 18 (SUTURE)
ENDOLOOP SUT PDS II 0 18 (SUTURE) IMPLANT
GLOVE BIO SURGEON STRL SZ7.5 (GLOVE) ×3 IMPLANT
GOWN STRL REUS W/ TWL LRG LVL3 (GOWN DISPOSABLE) ×3 IMPLANT
GOWN STRL REUS W/TWL LRG LVL3 (GOWN DISPOSABLE) ×6
KIT BASIN OR (CUSTOM PROCEDURE TRAY) ×3 IMPLANT
KIT ROOM TURNOVER OR (KITS) ×3 IMPLANT
LIQUID BAND (GAUZE/BANDAGES/DRESSINGS) ×3 IMPLANT
NS IRRIG 1000ML POUR BTL (IV SOLUTION) ×3 IMPLANT
PAD ARMBOARD 7.5X6 YLW CONV (MISCELLANEOUS) ×6 IMPLANT
POUCH SPECIMEN RETRIEVAL 10MM (ENDOMECHANICALS) ×3 IMPLANT
RELOAD STAPLE TA45 3.5 REG BLU (ENDOMECHANICALS) ×6 IMPLANT
SCALPEL HARMONIC ACE (MISCELLANEOUS) ×3 IMPLANT
SET IRRIG TUBING LAPAROSCOPIC (IRRIGATION / IRRIGATOR) ×3 IMPLANT
SPECIMEN JAR SMALL (MISCELLANEOUS) ×3 IMPLANT
SUT MNCRL AB 4-0 PS2 18 (SUTURE) ×3 IMPLANT
TOWEL OR 17X24 6PK STRL BLUE (TOWEL DISPOSABLE) ×3 IMPLANT
TOWEL OR 17X26 10 PK STRL BLUE (TOWEL DISPOSABLE) ×3 IMPLANT
TRAY FOLEY CATH 16FR SILVER (SET/KITS/TRAYS/PACK) ×3 IMPLANT
TRAY LAPAROSCOPIC (CUSTOM PROCEDURE TRAY) ×3 IMPLANT
TROCAR XCEL BLUNT TIP 100MML (ENDOMECHANICALS) ×3 IMPLANT
TROCAR XCEL NON-BLD 5MMX100MML (ENDOMECHANICALS) ×6 IMPLANT
TUBING INSUFFLATION (TUBING) ×3 IMPLANT

## 2015-02-04 SURGICAL SUPPLY — 32 items
APPLIER CLIP ROT 10 11.4 M/L (STAPLE)
CLIP APPLIE ROT 10 11.4 M/L (STAPLE) IMPLANT
CUTTER FLEX LINEAR 45M (STAPLE) IMPLANT
DECANTER SPIKE VIAL GLASS SM (MISCELLANEOUS) ×3 IMPLANT
DRAPE LAPAROSCOPIC ABDOMINAL (DRAPES) ×3 IMPLANT
DRAPE UTILITY XL STRL (DRAPES) ×3 IMPLANT
ELECT REM PT RETURN 9FT ADLT (ELECTROSURGICAL) ×3
ELECTRODE REM PT RTRN 9FT ADLT (ELECTROSURGICAL) ×1 IMPLANT
ENDOLOOP SUT PDS II  0 18 (SUTURE)
ENDOLOOP SUT PDS II 0 18 (SUTURE) IMPLANT
GLOVE BIO SURGEON STRL SZ7.5 (GLOVE) ×3 IMPLANT
GOWN STRL REUS W/ TWL XL LVL3 (GOWN DISPOSABLE) ×1 IMPLANT
GOWN STRL REUS W/TWL XL LVL3 (GOWN DISPOSABLE) ×8 IMPLANT
IV LACTATED RINGERS 1000ML (IV SOLUTION) ×3 IMPLANT
KIT BASIN OR (CUSTOM PROCEDURE TRAY) ×3 IMPLANT
LIQUID BAND (GAUZE/BANDAGES/DRESSINGS) ×3 IMPLANT
NS IRRIG 1000ML POUR BTL (IV SOLUTION) ×3 IMPLANT
PENCIL BUTTON HOLSTER BLD 10FT (ELECTRODE) ×3 IMPLANT
POUCH SPECIMEN RETRIEVAL 10MM (ENDOMECHANICALS) IMPLANT
RELOAD 45 VASCULAR/THIN (ENDOMECHANICALS) IMPLANT
RELOAD STAPLE TA45 3.5 REG BLU (ENDOMECHANICALS) IMPLANT
SET IRRIG TUBING LAPAROSCOPIC (IRRIGATION / IRRIGATOR) ×3 IMPLANT
SHEARS HARMONIC ACE PLUS 36CM (ENDOMECHANICALS) IMPLANT
SOLUTION ANTI FOG 6CC (MISCELLANEOUS) ×3 IMPLANT
SUT MNCRL AB 4-0 PS2 18 (SUTURE) ×3 IMPLANT
TOWEL OR 17X26 10 PK STRL BLUE (TOWEL DISPOSABLE) ×3 IMPLANT
TRAY FOLEY CATH 14FRSI W/METER (CATHETERS) ×3 IMPLANT
TRAY LAPAROSCOPIC (CUSTOM PROCEDURE TRAY) ×3 IMPLANT
TROCAR BLADELESS OPT 5 75 (ENDOMECHANICALS) ×3 IMPLANT
TROCAR SLEEVE XCEL 5X75 (ENDOMECHANICALS) ×3 IMPLANT
TROCAR XCEL BLUNT TIP 100MML (ENDOMECHANICALS) ×3 IMPLANT
TUBING INSUFFLATION 10FT LAP (TUBING) ×3 IMPLANT

## 2015-02-04 NOTE — Transfer of Care (Signed)
Immediate Anesthesia Transfer of Care Note  Patient: Thomas Glover  Procedure(s) Performed: Procedure(s): APPENDECTOMY LAPAROSCOPIC (N/A)  Patient Location: PACU  Anesthesia Type:General  Level of Consciousness: awake and alert   Airway & Oxygen Therapy: Patient Spontanous Breathing and Patient connected to nasal cannula oxygen  Post-op Assessment: Report given to RN, Post -op Vital signs reviewed and stable and Patient moving all extremities  Post vital signs: Reviewed and stable  Last Vitals:  Filed Vitals:   02/04/15 1018  BP: 114/63  Pulse: 62  Temp: 36.9 C  Resp: 16    Complications: No apparent anesthesia complications

## 2015-02-04 NOTE — Anesthesia Preprocedure Evaluation (Addendum)
Anesthesia Evaluation  Patient identified by MRN, date of birth, ID band Patient awake    Reviewed: Allergy & Precautions, NPO status , Patient's Chart, lab work & pertinent test results, reviewed documented beta blocker date and time   Airway Mallampati: II  TM Distance: >3 FB Neck ROM: Full    Dental no notable dental hx. (+) Teeth Intact, Dental Advisory Given, Chipped,    Pulmonary Current Smoker,  breath sounds clear to auscultation  Pulmonary exam normal       Cardiovascular negative cardio ROS  Rhythm:Regular Rate:Normal     Neuro/Psych negative neurological ROS  negative psych ROS   GI/Hepatic Neg liver ROS, GERD-  Medicated and Controlled,  Endo/Other  negative endocrine ROS  Renal/GU negative Renal ROS     Musculoskeletal negative musculoskeletal ROS (+)   Abdominal (+)  Abdomen: soft. Bowel sounds: normal.  Peds  Hematology negative hematology ROS (+)   Anesthesia Other Findings   Reproductive/Obstetrics negative OB ROS                           Anesthesia Physical Anesthesia Plan  ASA: II and emergent  Anesthesia Plan: General   Post-op Pain Management:    Induction: Intravenous and Rapid sequence  Airway Management Planned: Oral ETT  Additional Equipment: None  Intra-op Plan:   Post-operative Plan: Extubation in OR  Informed Consent: I have reviewed the patients History and Physical, chart, labs and discussed the procedure including the risks, benefits and alternatives for the proposed anesthesia with the patient or authorized representative who has indicated his/her understanding and acceptance.   Dental advisory given  Plan Discussed with: CRNA  Anesthesia Plan Comments:         Anesthesia Quick Evaluation

## 2015-02-04 NOTE — Progress Notes (Signed)
UR completed 

## 2015-02-04 NOTE — Anesthesia Procedure Notes (Signed)
Procedure Name: Intubation Date/Time: 02/04/2015 11:03 AM Performed by: Ellin GoodieWEAVER, Dhairya Corales M Pre-anesthesia Checklist: Patient identified, Emergency Drugs available, Suction available, Patient being monitored and Timeout performed Patient Re-evaluated:Patient Re-evaluated prior to inductionOxygen Delivery Method: Circle system utilized Preoxygenation: Pre-oxygenation with 100% oxygen Intubation Type: IV induction and Rapid sequence Ventilation: Mask ventilation without difficulty Laryngoscope Size: Mac and 3 Grade View: Grade I Tube type: Oral Tube size: 7.5 mm Number of attempts: 1 Airway Equipment and Method: Stylet Placement Confirmation: ETT inserted through vocal cords under direct vision,  positive ETCO2 and breath sounds checked- equal and bilateral Secured at: 23 cm Tube secured with: Tape Dental Injury: Teeth and Oropharynx as per pre-operative assessment

## 2015-02-04 NOTE — Anesthesia Postprocedure Evaluation (Signed)
Anesthesia Post Note  Patient: Thomas Glover  Procedure(s) Performed: Procedure(s) (LRB): APPENDECTOMY LAPAROSCOPIC (N/A)  Anesthesia type: General  Patient location: PACU  Post pain: Pain level controlled  Post assessment: Post-op Vital signs reviewed  Last Vitals: BP 122/57 mmHg  Pulse 78  Temp(Src) 36.7 C (Oral)  Resp 16  Ht 6\' 2"  (1.88 m)  Wt 175 lb (79.379 kg)  BMI 22.46 kg/m2  SpO2 98%  Post vital signs: Reviewed  Level of consciousness: sedated  Complications: No apparent anesthesia complications

## 2015-02-04 NOTE — H&P (Signed)
Thomas Glover is an 49 y.o. male.   Chief Complaint: abdominal pain HPI:  The patient is a 49 year old white male who began having abdominal pain Saturday morning. The pain was initially central but has moved to the right lower quadrant. The pain was severe enough to double him over and he went to the emergency department. The pain has been associated with nausea but no vomiting. He also reports chills. He went to the emergency department where a CT scan showed that he had appendicitis but no evidence of perforation  History reviewed. No pertinent past medical history.  Past Surgical History  Procedure Laterality Date  . Cholecystectomy      History reviewed. No pertinent family history. Social History:  reports that he has been smoking.  He has never used smokeless tobacco. He reports that he does not drink alcohol or use illicit drugs.  Allergies: No Known Allergies   (Not in a hospital admission)  Results for orders placed or performed during the hospital encounter of 02/03/15 (from the past 48 hour(s))  Urinalysis, Routine w reflex microscopic     Status: None   Collection Time: 02/03/15  5:41 PM  Result Value Ref Range   Color, Urine YELLOW YELLOW   APPearance CLEAR CLEAR   Specific Gravity, Urine 1.016 1.005 - 1.030   pH 6.0 5.0 - 8.0   Glucose, UA NEGATIVE NEGATIVE mg/dL   Hgb urine dipstick NEGATIVE NEGATIVE   Bilirubin Urine NEGATIVE NEGATIVE   Ketones, ur NEGATIVE NEGATIVE mg/dL   Protein, ur NEGATIVE NEGATIVE mg/dL   Urobilinogen, UA 0.2 0.0 - 1.0 mg/dL   Nitrite NEGATIVE NEGATIVE   Leukocytes, UA NEGATIVE NEGATIVE    Comment: MICROSCOPIC NOT DONE ON URINES WITH NEGATIVE PROTEIN, BLOOD, LEUKOCYTES, NITRITE, OR GLUCOSE <1000 mg/dL.  CBC with Differential/Platelet     Status: Abnormal   Collection Time: 02/03/15  6:35 PM  Result Value Ref Range   WBC 13.6 (H) 4.0 - 10.5 K/uL   RBC 4.74 4.22 - 5.81 MIL/uL   Hemoglobin 15.5 13.0 - 17.0 g/dL   HCT 44.6 39.0 - 52.0 %    MCV 94.1 78.0 - 100.0 fL   MCH 32.7 26.0 - 34.0 pg   MCHC 34.8 30.0 - 36.0 g/dL   RDW 12.3 11.5 - 15.5 %   Platelets 167 150 - 400 K/uL   Neutrophils Relative % 83 (H) 43 - 77 %   Neutro Abs 11.2 (H) 1.7 - 7.7 K/uL   Lymphocytes Relative 13 12 - 46 %   Lymphs Abs 1.8 0.7 - 4.0 K/uL   Monocytes Relative 4 3 - 12 %   Monocytes Absolute 0.5 0.1 - 1.0 K/uL   Eosinophils Relative 0 0 - 5 %   Eosinophils Absolute 0.1 0.0 - 0.7 K/uL   Basophils Relative 0 0 - 1 %   Basophils Absolute 0.0 0.0 - 0.1 K/uL  Comprehensive metabolic panel     Status: Abnormal   Collection Time: 02/03/15  6:35 PM  Result Value Ref Range   Sodium 134 (L) 135 - 145 mmol/L   Potassium 3.5 3.5 - 5.1 mmol/L   Chloride 105 96 - 112 mmol/L   CO2 24 19 - 32 mmol/L   Glucose, Bld 108 (H) 70 - 99 mg/dL   BUN 9 6 - 23 mg/dL   Creatinine, Ser 0.75 0.50 - 1.35 mg/dL   Calcium 8.8 8.4 - 10.5 mg/dL   Total Protein 6.6 6.0 - 8.3 g/dL   Albumin 4.4 3.5 -  5.2 g/dL   AST 18 0 - 37 U/L   ALT 18 0 - 53 U/L   Alkaline Phosphatase 109 39 - 117 U/L   Total Bilirubin 0.8 0.3 - 1.2 mg/dL   GFR calc non Af Amer >90 >90 mL/min   GFR calc Af Amer >90 >90 mL/min    Comment: (NOTE) The eGFR has been calculated using the CKD EPI equation. This calculation has not been validated in all clinical situations. eGFR's persistently <90 mL/min signify possible Chronic Kidney Disease.    Anion gap 5 5 - 15  Lipase, blood     Status: None   Collection Time: 02/03/15  6:35 PM  Result Value Ref Range   Lipase 21 11 - 59 U/L   Ct Abdomen Pelvis W Contrast  02/03/2015   CLINICAL DATA:  Severe right lower lower quadrant abdominal pain associated with chills which began earlier today. Leukocytosis [place] of [address]. Tenderness to palpation in the right lower quadrant at clinical examination. Surgical history includes cholecystectomy.  EXAM: CT ABDOMEN AND PELVIS WITH CONTRAST  TECHNIQUE: Multidetector CT imaging of the abdomen and  pelvis was performed using the standard protocol following bolus administration of intravenous contrast.  CONTRAST:  100 ml OMNIPAQUE IOHEXOL 300 MG/ML IV. Oral contrast was also administered.  COMPARISON:  12/14/2013.  FINDINGS: Mild wall thickening and edema involving the cecum, ascending colon, and proximal and mid transverse colon with similar findings to a lesser degree involving the descending colon, though these segments are nearly completely collapsed. Sigmoid colon and rectum normal in appearance. Small bowel normal in appearance. Normal appearing stomach filled with oral contrast. Mildly dilated appendix up to approximately 10 mm with mild edema/ inflammation adjacent to the appendiceal tip in the right upper pelvis. No ascites. No extraluminal gas. No abnormal fluid collections.  Normal appearing liver, spleen, pancreas, adrenal glands and kidneys. Gallbladder surgically absent. No unexpected biliary ductal dilation. Moderate aortoiliofemoral atherosclerosis. No significant lymphadenopathy.  Urinary bladder decompressed and unremarkable. Borderline prostate gland enlargement. Normal seminal vesicles.  Bone window images unremarkable. Visualized lung bases clear apart from the expected dependent atelectasis posteriorly. Heart size upper normal.  IMPRESSION: 1. Mildly dilated appendix up to approximately 10 mm with early acute appendicitis suspected, especially involving the tip of the appendix. No evidence of perforation or abscess. 2. Wall thickening involving a large portion of the colon which I suspect is related to the fact that these segments are nearly completely collapsed. If the patient has had diarrhea, then this could represent colitis. 3. Moderate aortoiliofemoral atherosclerosis, somewhat advanced for age. These results results were called by telephone at the time of interpretation on 02/03/2015 at 8:28 pm to Idelia Salm, MD, who verbally acknowledged these results.   Electronically Signed   By:  Evangeline Dakin M.D.   On: 02/03/2015 20:31    Review of Systems  Constitutional: Positive for chills.  HENT: Negative.   Eyes: Negative.   Respiratory: Negative.   Cardiovascular: Negative.   Gastrointestinal: Positive for nausea and abdominal pain.  Genitourinary: Negative.   Musculoskeletal: Negative.   Skin: Negative.   Neurological: Negative.   Endo/Heme/Allergies: Negative.   Psychiatric/Behavioral: Negative.     Blood pressure 122/66, pulse 62, temperature 98.4 F (36.9 C), temperature source Oral, resp. rate 18, height _0  (1.88 m), weight 175 lb (79.379 kg), SpO2 100 %. Physical Exam  Constitutional: He is oriented to person, place, and time. He appears well-developed and well-nourished.  HENT:  Head: Normocephalic  and atraumatic.  Eyes: Conjunctivae and EOM are normal. Pupils are equal, round, and reactive to light.  Neck: Normal range of motion. Neck supple.  Cardiovascular: Normal rate, regular rhythm and normal heart sounds.   Respiratory: Effort normal and breath sounds normal.  GI: Soft. Bowel sounds are normal.  There is tenderness in RLQ  Musculoskeletal: Normal range of motion.  Neurological: He is alert and oriented to person, place, and time.  Skin: Skin is warm and dry.  Psychiatric: He has a normal mood and affect. His behavior is normal.     Assessment/Plan  The patient appears to have acute appendicitis. Because of the risk of perforation and sepsis I think he would benefit from having his appendix removed. I have discussed with him in detail the risks and benefits of the operation to remove the appendix as well as some of the technical aspects and he understands and wishes to proceed. We will admit him and start him on Zosyn. We will plan for laparoscopic appendectomy in the morning.  TOTH III,Dontavion Noxon S 02/04/2015, 2:35 AM

## 2015-02-04 NOTE — Progress Notes (Signed)
Patient is alert and stable. Still having pain.  Dr. Carolynne Edouardoth plans to proceed with appendectomy today.   Thomas Glover,Thomas Glover

## 2015-02-04 NOTE — Op Note (Signed)
02/03/2015 - 02/04/2015  11:44 AM  PATIENT:  Thomas Glover  49 y.o. male  PRE-OPERATIVE DIAGNOSIS:  appendicitis  POST-OPERATIVE DIAGNOSIS:  appendicitis  PROCEDURE:  Procedure(s): APPENDECTOMY LAPAROSCOPIC (N/A)  SURGEON:  Surgeon(s) and Role:    * Griselda Miner, MD - Primary  PHYSICIAN ASSISTANT:   ASSISTANTS: none   ANESTHESIA:   general  EBL:     BLOOD ADMINISTERED:none  DRAINS: none   LOCAL MEDICATIONS USED:  MARCAINE     SPECIMEN:  Source of Specimen:  appendix  DISPOSITION OF SPECIMEN:  PATHOLOGY  COUNTS:  YES  TOURNIQUET:  * No tourniquets in log *  DICTATION: .Dragon Dictation  After informed consent was obtained patient was brought to the operating room placed in the supine position on the operating room table. After adequate induction of general anesthesia the patient's abdomen was prepped with ChloraPrep, allowed to dry, and draped in usual sterile manner. The area below the umbilicus was infiltrated with quarter percent Marcaine. A small incision was made with a 15 blade knife. This incision was carried down through the subcutaneous tissue bluntly with a hemostat and Army-Navy retractors until the linea alba was identified. The linea alba was incised with a 15 blade knife. Each side was grasped Coker clamps and elevated anteriorly. The preperitoneal space was probed bluntly with a hemostat until the peritoneum was opened and access was gained to the abdominal cavity. A 0 Vicryl purse string stitch was placed in the fascia surrounding the opening. A Hassan cannula was placed through the opening and anchored in place with the previously placed Vicryl purse string stitch. The laparoscope was placed through the [facility] cannula. The abdomen was insufflated with carbon dioxide without difficulty. Next the suprapubic area was infiltrated with quarter percent Marcaine. A small incision was made with a 15 blade knife. A 5 mm port was placed bluntly through this incision into  the abdominal cavity. A site was then chosen between the 2 port for placement of a 5 mm port. The area was infiltrated with quarter percent Marcaine. A small stab incision was made with a 15 blade knife. A 5 mm port was placed bluntly through this incision and the abdominal cavity under direct vision. The laparoscope was then moved to the suprapubic port. Using a Glassman grasper and harmonic scalpel the right lower quadrant was inspected. The appendix was readily identified. The appendix was elevated anteriorly and the mesoappendix was taken down sharply with the harmonic scalpel. Once the base of the appendix where it joined the cecum was identified and cleared of any tissue then a laparoscopic GIA blue load 6 row stapler was placed through the [facility] cannula. The stapler was placed across the base of the appendix clamped and fired thereby dividing the base of the appendix between staple lines. A laparoscopic bag was then inserted through the [facility] cannula. The appendix was placed within the bag and the bag was sealed. The abdomen was then irrigated with copious amounts of saline until the effluent was clear. No other abnormalities were noted. The appendix and bag were removed with the [facility] cannula through the infraumbilical port without difficulty. The fascial defect was closed with the previously placed Vicryl pursestring stitch as well as with another interrupted 0 Vicryl figure-of-eight stitch. The rest of the ports were removed under direct vision and were found to be hemostatic. The gas was allowed to escape. The skin incisions were closed with interrupted 4-0 Monocryl subcuticular stitches. Dermabond dressings were applied. The patient tolerated the  procedure well. At the end of the case all needle sponge and instrument counts were correct. The patient was then awakened and taken to recovery in stable condition.  PLAN OF CARE: Admit for overnight observation  PATIENT DISPOSITION:  PACU -  hemodynamically stable.   Delay start of Pharmacological VTE agent (>24hrs) due to surgical blood loss or risk of bleeding: not applicable

## 2015-02-05 ENCOUNTER — Encounter (HOSPITAL_COMMUNITY): Payer: Self-pay | Admitting: General Surgery

## 2015-02-05 MED ORDER — HYDROCODONE-ACETAMINOPHEN 5-325 MG PO TABS: 1.0000 | ORAL_TABLET | ORAL | Status: DC | PRN

## 2015-02-05 MED ORDER — HYDROCODONE-ACETAMINOPHEN 5-325 MG PO TABS: 1.0000 | ORAL_TABLET | Freq: Four times a day (QID) | ORAL | Status: DC | PRN

## 2015-02-05 NOTE — Progress Notes (Signed)
Discharge home. Home discharge instruction given, no question verbalized. 

## 2015-02-05 NOTE — Discharge Instructions (Signed)

## 2015-02-05 NOTE — Discharge Summary (Signed)
Physician Discharge Summary  Patient ID: Thomas Glover MRN: [id number] DOB/AGE: 1966-01-15 49 y.o.  Admit date: 02/03/2015 Discharge date: 02/05/2015  Admitting Diagnosis: Acute appendicitis   Discharge Diagnosis Patient Active Problem List   Diagnosis Date Noted  . Appendicitis 02/04/2015    Consultants none  Imaging: Ct Abdomen Pelvis W Contrast  02/03/2015   CLINICAL DATA:  Severe right lower lower quadrant abdominal pain associated with chills which began earlier today. Leukocytosis with white blood count of [id number]. Tenderness to palpation in the right lower quadrant at clinical examination. Surgical history includes cholecystectomy.  EXAM: CT ABDOMEN AND PELVIS WITH CONTRAST  TECHNIQUE: Multidetector CT imaging of the abdomen and pelvis was performed using the standard protocol following bolus administration of intravenous contrast.  CONTRAST:  100 ml OMNIPAQUE IOHEXOL 300 MG/ML IV. Oral contrast was also administered.  COMPARISON:  12/14/2013.  FINDINGS: Mild wall thickening and edema involving the cecum, ascending colon, and proximal and mid transverse colon with similar findings to a lesser degree involving the descending colon, though these segments are nearly completely collapsed. Sigmoid colon and rectum normal in appearance. Small bowel normal in appearance. Normal appearing stomach filled with oral contrast. Mildly dilated appendix up to approximately 10 mm with mild edema/ inflammation adjacent to the appendiceal tip in the right upper pelvis. No ascites. No extraluminal gas. No abnormal fluid collections.  Normal appearing liver, spleen, pancreas, adrenal glands and kidneys. Gallbladder surgically absent. No unexpected biliary ductal dilation. Moderate aortoiliofemoral atherosclerosis. No significant lymphadenopathy.  Urinary bladder decompressed and unremarkable. Borderline prostate gland enlargement. Normal seminal vesicles.  Bone window images unremarkable. Visualized lung bases  clear apart from the expected dependent atelectasis posteriorly. Heart size upper normal.  IMPRESSION: 1. Mildly dilated appendix up to approximately 10 mm with early acute appendicitis suspected, especially involving the tip of the appendix. No evidence of perforation or abscess. 2. Wall thickening involving a large portion of the colon which I suspect is related to the fact that these segments are nearly completely collapsed. If the patient has had diarrhea, then this could represent colitis. 3. Moderate aortoiliofemoral atherosclerosis, somewhat advanced for age. These results results were called by telephone at the time of interpretation on 02/03/2015 at 8:28 pm to Cephus RicherJosh Zavitz, MD, who verbally acknowledged these results.   Electronically Signed   By: Hulan Saashomas  Lawrence M.D.   On: 02/03/2015 20:31    Procedures Laparoscopic appendectomy---Dr. Carolynne Edouardoth 02/04/15  Hospital Course:  Thomas Glover is a healthy [facility].  Workup showed acute appendicitis.  [facility] reasons.  Patient was admitted and underwent procedure listed above.  Tolerated procedure well and was transferred to the floor.  Diet was advanced as tolerated.  On POD#1, the patient was voiding well, tolerating diet, ambulating well, pain well controlled, vital signs stable, incisions c/d/i and felt stable for discharge home. Medication risks, benefits and therapeutic alternatives were reviewed with the patient.  He verbalizes understanding.  Patient will follow up in our office in 3 weeks and knows to call with questions or concerns.  Physical Exam: General:  Alert, NAD, pleasant, comfortable Abd:  Soft, ND, mild tenderness, incisions C/D/I    Medication List    TAKE these medications        DM-Phenylephrine-Acetaminophen 10-5-325 MG Caps  Take 2 tablets by mouth every 4 (four) hours as needed (cold symtpoms).     esomeprazole 20 MG capsule  Commonly known  as:  NEXIUM  Take 20 mg by mouth daily at 12 noon.     HYDROcodone-acetaminophen 5-325 MG per tablet  Commonly known as:  NORCO/VICODIN  Take 1-2 tablets by mouth every 6 (six) hours as needed for moderate pain or severe pain.     naproxen sodium 220 MG tablet  Commonly known as:  ANAPROX  Take 440 mg by mouth 2 (two) times daily as needed (pain).     zolpidem 10 MG tablet  Commonly known as:  AMBIEN  Take 10 mg by mouth at bedtime as needed. sleep             Follow-up Information    Follow up with [facility] OFFICE [place] On 02/27/2015.   Why:  arrive by 1PM for a 1:30PM post op check   Contact information:   [address] [place] [address] [phone]      Signed: Ashok Norris, [facility] [phone]  02/05/2015, 9:18 AM

## 2015-11-02 ENCOUNTER — Emergency Department (HOSPITAL_BASED_OUTPATIENT_CLINIC_OR_DEPARTMENT_OTHER): Payer: BLUE CROSS/BLUE SHIELD

## 2015-11-02 ENCOUNTER — Encounter (HOSPITAL_BASED_OUTPATIENT_CLINIC_OR_DEPARTMENT_OTHER): Payer: Self-pay | Admitting: *Deleted

## 2015-11-02 ENCOUNTER — Emergency Department (HOSPITAL_BASED_OUTPATIENT_CLINIC_OR_DEPARTMENT_OTHER)
Admission: EM | Admit: 2015-11-02 | Discharge: 2015-11-02 | Disposition: A | Discharge status: Home / Self Care | Payer: BLUE CROSS/BLUE SHIELD | Attending: Emergency Medicine | Admitting: Emergency Medicine

## 2015-11-02 DIAGNOSIS — Y9389 Activity, other specified: Secondary | ICD-10-CM | POA: Diagnosis not present

## 2015-11-02 DIAGNOSIS — S6991XA Unspecified injury of right wrist, hand and finger(s), initial encounter: Secondary | ICD-10-CM | POA: Diagnosis present

## 2015-11-02 DIAGNOSIS — K219 Gastro-esophageal reflux disease without esophagitis: Secondary | ICD-10-CM | POA: Diagnosis not present

## 2015-11-02 DIAGNOSIS — Y9289 Other specified places as the place of occurrence of the external cause: Secondary | ICD-10-CM | POA: Insufficient documentation

## 2015-11-02 DIAGNOSIS — W228XXA Striking against or struck by other objects, initial encounter: Secondary | ICD-10-CM | POA: Insufficient documentation

## 2015-11-02 DIAGNOSIS — M79641 Pain in right hand: Secondary | ICD-10-CM

## 2015-11-02 DIAGNOSIS — S61411A Laceration without foreign body of right hand, initial encounter: Secondary | ICD-10-CM | POA: Diagnosis not present

## 2015-11-02 DIAGNOSIS — Z79899 Other long term (current) drug therapy: Secondary | ICD-10-CM | POA: Diagnosis not present

## 2015-11-02 DIAGNOSIS — Z23 Encounter for immunization: Secondary | ICD-10-CM | POA: Diagnosis not present

## 2015-11-02 DIAGNOSIS — Y990 Civilian activity done for income or pay: Secondary | ICD-10-CM | POA: Diagnosis not present

## 2015-11-02 HISTORY — DX: Gastro-esophageal reflux disease without esophagitis: K21.9

## 2015-11-02 MED ORDER — TETANUS-DIPHTH-ACELL PERTUSSIS 5-2.5-18.5 LF-MCG/0.5 IM SUSP
0.5000 mL | Freq: Once | INTRAMUSCULAR | Status: AC
  Administered 2015-11-02: 0.5 mL via INTRAMUSCULAR
  Filled 2015-11-02: qty 0.5

## 2015-11-02 MED ORDER — LIDOCAINE HCL (PF) 1 % IJ SOLN
10.0000 mL | Freq: Once | INTRAMUSCULAR | Status: AC
  Administered 2015-11-02: 10 mL via INTRADERMAL
  Filled 2015-11-02: qty 10

## 2015-11-02 NOTE — Discharge Instructions (Signed)

## 2015-11-02 NOTE — ED Notes (Signed)
C/o right hand injury at 0600 this am. States pulling on a bolt and hit clamp with his hand. 1 inch laceration to knuckle of ring finger of right hand. Able to move fingers.

## 2015-11-02 NOTE — ED Notes (Signed)
Hand irrigated with sterile water and betadine. Tolerated well.

## 2015-11-02 NOTE — ED Provider Notes (Signed)
CSN: [id number]     Arrival date & time 11/02/15  0741 History   First MD Initiated Contact with Patient 11/02/15 0800     No chief complaint on file.    (Consider location/radiation/quality/duration/timing/severity/associated sxs/prior Treatment) Patient is a 49 y.o. male presenting with skin laceration.  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 GERD (gastroesophageal reflux disease)    Past Surgical History  Procedure Laterality Date  . Cholecystectomy    . Laparoscopic appendectomy N/A 02/04/2015    Procedure: APPENDECTOMY LAPAROSCOPIC;  Surgeon: Chevis Pretty III, MD;  Location: [facility] OR;  Service: General;  Laterality: N/A;   No family history on file. Social History  Substance Use Topics  . Smoking status: Current Every Day Smoker -- 1.00 packs/day    Types: Cigarettes  . Smokeless tobacco: Never Used  . Alcohol Use: No    Review of Systems  Constitutional: Negative for fever.  HENT: Negative for sore throat.   Eyes: Negative for visual disturbance.  Respiratory: Negative for shortness of breath.   Cardiovascular: Negative for chest pain.  Gastrointestinal: Negative for abdominal pain.  Genitourinary: Negative for difficulty urinating.  Musculoskeletal: Positive for arthralgias. Negative for back pain and neck stiffness.  Skin: Positive for wound. Negative for rash.  Neurological: Negative for syncope and headaches.      Allergies  Review of patient's allergies indicates no known allergies.  Home Medications   Prior to Admission medications   Medication  Sig Start Date End Date Taking? Authorizing Provider  esomeprazole (NEXIUM) 20 MG capsule Take 20 mg by mouth daily at 12 noon.   Yes Historical Provider, MD   BP 146/84 mmHg  Pulse 70  Temp(Src) 98.7 F (37.1 C) (Oral)  Resp 20  Ht  (1.905 m)  Wt 175 lb (79.379 kg)  BMI 21.87 kg/m2  SpO2 99% Physical Exam  Constitutional: He is oriented to person, place, and time. He appears well-developed and well-nourished. No distress.  HENT:  Head: Normocephalic and atraumatic.  Eyes: Conjunctivae and EOM are normal.  Neck: Normal range of motion.  Cardiovascular: Normal rate, regular rhythm, normal heart sounds and intact distal pulses.  Exam reveals no gallop and no friction rub.   No murmur heard. Pulmonary/Chest: Effort normal and breath sounds normal. No respiratory distress. He has no wheezes. He has no rales.  Abdominal: Soft. He exhibits no distension. There is no tenderness. There is no guarding.  Musculoskeletal: He exhibits no edema.       Right hand: He exhibits laceration (3cm dorsum of ulnar side of right hand). He exhibits normal range of motion (full flexion and extension of all fingers and MCP), no bony tenderness, normal capillary refill, no deformity and no swelling. Normal sensation noted. Normal strength noted.  Neurological: He is alert and oriented to person, place, and time.  Skin: Skin is warm and dry. He is not diaphoretic.  Nursing note and vitals reviewed.   ED Course  .Marland KitchenLaceration Repair Date/Time: 11/03/2015 12:29 PM Performed by: Alvira Monday Authorized by: Alvira Monday Consent:  Verbal consent obtained. Risks and benefits: risks, benefits and alternatives were discussed Consent given by: patient Required items: required blood products, implants, devices, and special equipment available Time out: Immediately prior to procedure a "time out" was called to verify the correct patient, procedure, equipment, support staff and site/side marked as  required. Body area: upper extremity Location details: right hand Laceration length: 3 cm Foreign bodies: no foreign bodies Tendon involvement: none Nerve involvement: none Vascular damage: no Anesthesia: local infiltration Local anesthetic: lidocaine 1% without epinephrine Patient sedated: no Preparation: Patient was prepped and draped in the usual sterile fashion. Irrigation solution: saline Irrigation method: syringe Amount of cleaning: standard Debridement: none Degree of undermining: none Skin closure: 4-0 Prolene Number of sutures: 5 Technique: simple Approximation: close Approximation difficulty: simple Dressing: antibiotic ointment and 4x4 sterile gauze Patient tolerance: Patient tolerated the procedure well with no immediate complications   (including critical care time) Labs Review Labs Reviewed - No data to display  Imaging Review Dg Hand Complete Right  11/02/2015  CLINICAL DATA:  Pain following metal clamp hitting hand EXAM: RIGHT HAND - COMPLETE 3+ VIEW COMPARISON:  None. FINDINGS: Frontal, oblique, and lateral views were obtained. There is no demonstrable fracture or dislocation. The joint spaces appear intact. No erosive change. No radiopaque foreign body. There is soft tissue swelling medially. IMPRESSION: Soft tissue swelling medially. No fracture or dislocation. No radiopaque foreign body. Electronically Signed   By: Bretta BangWilliam  Woodruff III M.D.   On: 11/02/2015 08:20   I have personally reviewed and evaluated these images and lab results as part of my medical decision-making.   EKG Interpretation None      MDM   Final diagnoses:  Right hand pain   49yo right handed male presents with concern for right hand laceration sustained when trying to remove a bolt at work. XR shows no fx or foreign body.  Patient NV intact, no sign of tendon injury on exam.  Laceration thoroughly irrigated and closed using 4-0 prolene 5 sutures.  Recommend removal in approx 10  days. Provided splint to lessen movement of area and allow healing. Discussed signs of infection and wound care. Patient discharged in stable condition with understanding of reasons to return.   Alvira MondayErin Shriyan Arakawa, MD 11/03/15 [phone]

## 2023-12-01 ENCOUNTER — Other Ambulatory Visit: Payer: Self-pay

## 2023-12-01 ENCOUNTER — Ambulatory Visit
Admission: EM | Admit: 2023-12-01 | Discharge: 2023-12-01 | Disposition: A | Discharge status: Home / Self Care | Payer: BLUE CROSS/BLUE SHIELD

## 2023-12-01 ENCOUNTER — Ambulatory Visit: Payer: Self-pay

## 2023-12-01 ENCOUNTER — Encounter: Payer: Self-pay | Admitting: Emergency Medicine

## 2023-12-01 DIAGNOSIS — B9689 Other specified bacterial agents as the cause of diseases classified elsewhere: Secondary | ICD-10-CM | POA: Diagnosis not present

## 2023-12-01 DIAGNOSIS — J019 Acute sinusitis, unspecified: Secondary | ICD-10-CM | POA: Diagnosis not present

## 2023-12-01 MED ORDER — AMOXICILLIN-POT CLAVULANATE 875-125 MG PO TABS: 1.0000 | ORAL_TABLET | Freq: Two times a day (BID) | ORAL | 0 refills | Status: AC

## 2023-12-01 MED ORDER — GUAIFENESIN ER 600 MG PO TB12: 600.0000 mg | ORAL_TABLET | Freq: Two times a day (BID) | ORAL | 0 refills | Status: AC

## 2023-12-01 NOTE — Discharge Instructions (Signed)
Make sure you are drinking lots of water Take Mucinex twice a day Use the Nasocort daily Try sinus rinsing.  All of these things will help drain the sinuses  Take the Augmentin 2 times a day. See your doctor if not improving towards the end of the week

## 2023-12-01 NOTE — ED Provider Notes (Signed)
[facility]    CSN: [id number] Arrival date & time: 12/01/23  1137      History   Chief Complaint Chief Complaint  Patient presents with   Sinus Pressure    HPI Thomas Glover is a 57 y.o. male.   Patient states she has had cough and cold symptoms for 3 weeks.  Is been using over-the-counter medicines.  Mucinex extreme cold day and night.  He has Astelin nasal spray.  He has Nasacort nasal spray.  He has tried some saline rinses.  He states is getting worse over the last few days.  Symptoms concentrated in the right cheek, right nose, behind right eye.  He also has some right ear pain.  He thinks he has a sinus infection.  He states the drainage behind his left nostril is thick, yellow, purulent, foul, blood-streaked    Past Medical History:  Diagnosis Date   GERD (gastroesophageal reflux disease)     Patient Active Problem List   Diagnosis Date Noted   Appendicitis 02/04/2015    Past Surgical History:  Procedure Laterality Date   CHOLECYSTECTOMY     LAPAROSCOPIC APPENDECTOMY N/A 02/04/2015   Procedure: APPENDECTOMY LAPAROSCOPIC;  Surgeon: Chevis Pretty III, MD;  Location: [facility] OR;  Service: General;  Laterality: N/A;       Home Medications    Prior to Admission medications   Medication Sig Start Date End Date Taking? Authorizing Provider  amoxicillin-clavulanate (AUGMENTIN) 875-125 MG tablet Take 1 tablet by mouth every 12 (twelve) hours. 12/01/23  Yes Eustace Moore, MD  esomeprazole (NEXIUM) 20 MG capsule Take 20 mg by mouth daily at 12 noon.   Yes [provider]  guaiFENesin (MUCINEX) 600 MG 12 hr tablet Take 1 tablet (600 mg total) by mouth 2 (two) times daily. 12/01/23  Yes Eustace Moore, MD  triamcinolone (NASACORT ALLERGY 24HR) 55 MCG/ACT AERO nasal inhaler Place 1 spray into the nose daily. 03/15/14  Yes [provider]  zolpidem (AMBIEN) 10 MG tablet Take 10 mg by mouth at bedtime as needed. 01/06/23  Yes [provider]     Family History History reviewed. No pertinent family history.  Social History Social History   Tobacco Use   Smoking status: Every Day    Current packs/day: 1.00    Types: Cigarettes   Smokeless tobacco: Never  Vaping Use   Vaping status: Never Used  Substance Use Topics   Alcohol use: No   Drug use: No     Allergies   Patient has no known allergies.   Review of Systems Review of Systems  See HPI Physical Exam Triage Vital Signs ED Triage Vitals  Encounter Vitals Group     BP 12/01/23 1155 (!) 165/84     Systolic BP Percentile --      Diastolic BP Percentile --      Pulse Rate 12/01/23 1155 83     Resp 12/01/23 1155 18     Temp 12/01/23 1155 98.4 F (36.9 C)     Temp Source 12/01/23 1155 Oral     SpO2 12/01/23 1155 95 %     Weight 12/01/23 1200 180 lb (81.6 kg)     Height 12/01/23 1200 6\' 2"  (1.88 m)     Head Circumference --      Peak Flow --      Pain Score 12/01/23 1200 7     Pain Loc --      Pain Education --  Exclude from Growth Chart --    No data found.  Updated Vital Signs BP (!) 165/84 (BP Location: Right Arm)   Pulse 83   Temp 98.4 F (36.9 C) (Oral)   Resp 18   Ht 6\' 2"  (1.88 m)   Wt 81.6 kg   SpO2 95%   BMI 23.11 kg/m       Physical Exam Constitutional:      General: He is not in acute distress.    Appearance: He is well-developed and normal weight.  HENT:     Head: Normocephalic and atraumatic.     Right Ear: Tympanic membrane and ear canal normal.     Left Ear: Tympanic membrane and ear canal normal.     Nose: Congestion and rhinorrhea present.     Mouth/Throat:     Pharynx: Posterior oropharyngeal erythema present.     Comments: Right nostril is with yellow crusting.  Right TM dull.  Right maxillary sinus tender Eyes:     Conjunctiva/sclera: Conjunctivae normal.     Pupils: Pupils are equal, round, and reactive to light.  Cardiovascular:     Rate and Rhythm: Normal rate and regular rhythm.     Heart sounds:  Normal heart sounds.  Pulmonary:     Effort: Pulmonary effort is normal. No respiratory distress.     Breath sounds: Normal breath sounds.  Abdominal:     General: There is no distension.     Palpations: Abdomen is soft.  Musculoskeletal:        General: Normal range of motion.     Cervical back: Normal range of motion.  Lymphadenopathy:     Cervical: Cervical adenopathy present.  Skin:    General: Skin is warm and dry.  Neurological:     Mental Status: He is alert.      UC Treatments / Results  Labs (all labs ordered are listed, but only abnormal results are displayed) Labs Reviewed - No data to display  EKG   Radiology No results found.  Procedures Procedures (including critical care time)  Medications Ordered in UC Medications - No data to display  Initial Impression / Assessment and Plan / UC Course  I have reviewed the triage vital signs and the nursing notes.  Pertinent labs & imaging results that were available during my care of the patient were reviewed by me and considered in my medical decision making (see chart for details).     Final Clinical Impressions(s) / UC Diagnoses   Final diagnoses:  Acute bacterial sinusitis  Acute sinusitis with symptoms > 10 days     Discharge Instructions      Make sure you are drinking lots of water Take Mucinex twice a day Use the Nasocort daily Try sinus rinsing.  All of these things will help drain the sinuses  Take the Augmentin 2 times a day. See your doctor if not improving towards the end of the week    ED Prescriptions     Medication Sig Dispense Auth. Provider   amoxicillin-clavulanate (AUGMENTIN) 875-125 MG tablet Take 1 tablet by mouth every 12 (twelve) hours. 14 tablet Eustace Moore, MD   guaiFENesin (MUCINEX) 600 MG 12 hr tablet Take 1 tablet (600 mg total) by mouth 2 (two) times daily. 20 tablet Eustace Moore, MD      PDMP not reviewed this encounter.   Eustace Moore,  MD 12/01/23 [phone]

## 2023-12-01 NOTE — ED Triage Notes (Signed)
Patient c/o sinus infection, congestion, nasal drainage, productive cough x 2 weeks.  Patient has taken Mucinex.
